# Patient Record
Sex: Male | Born: 2000 | Race: Black or African American | Hispanic: No | Marital: Single | Smoking: Never smoker
Health system: Southern US, Community
[De-identification: ages and names within clinical notes are randomized; demographics above are authoritative.]

---
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
## 2019-09-29 IMAGING — DX DG SHOULDER 2+V*R*
4 series · 4 of 4 positions shown · non-contrast
Comparison: None.

CLINICAL DATA: Basketball injury 1 month ago with persistent
shoulder pain, initial encounter

EXAM:
RIGHT SHOULDER - 2+ VIEW

[shoulder ap]
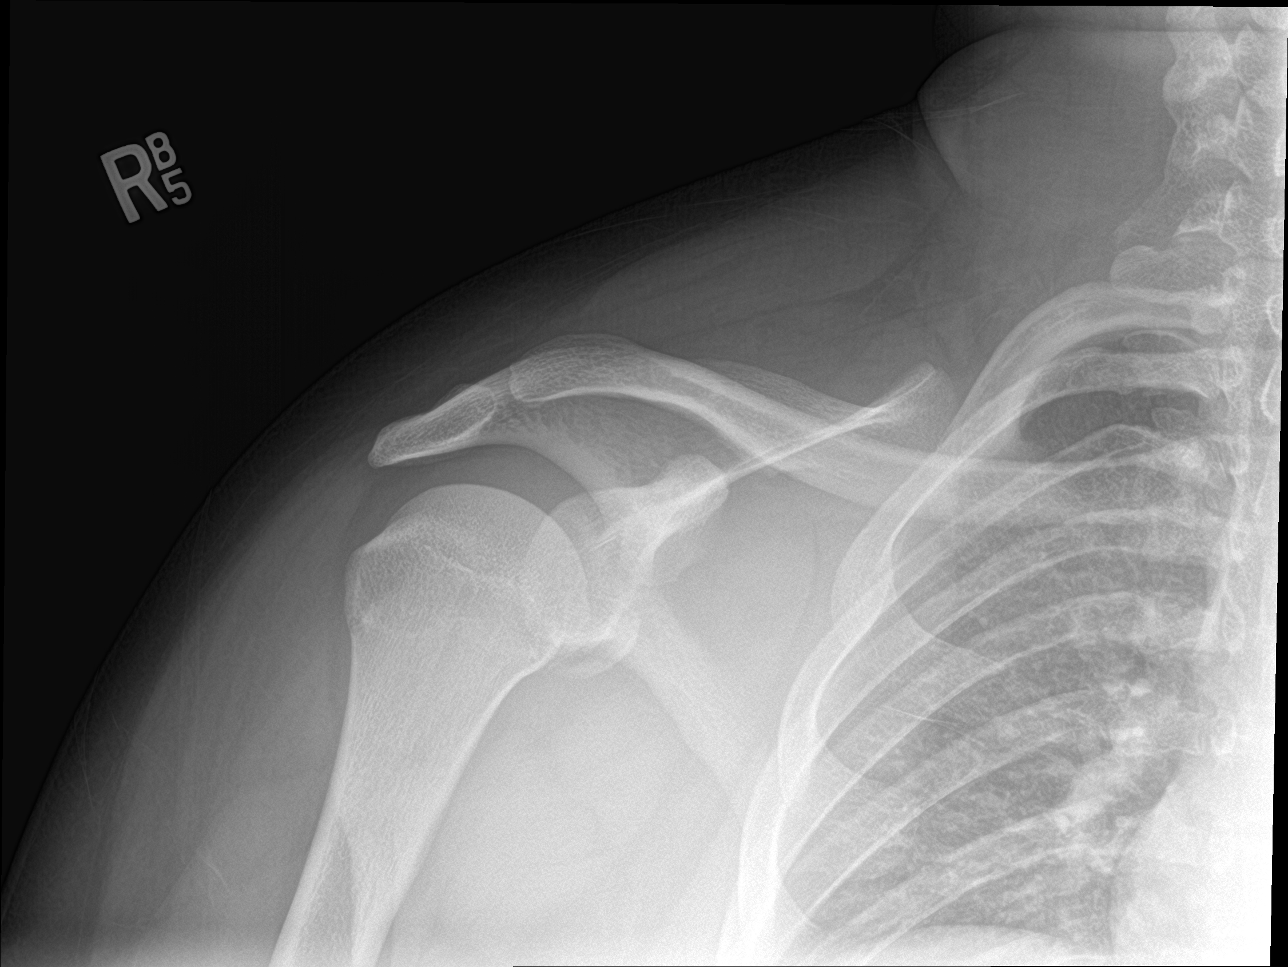

[shoulder grashey]
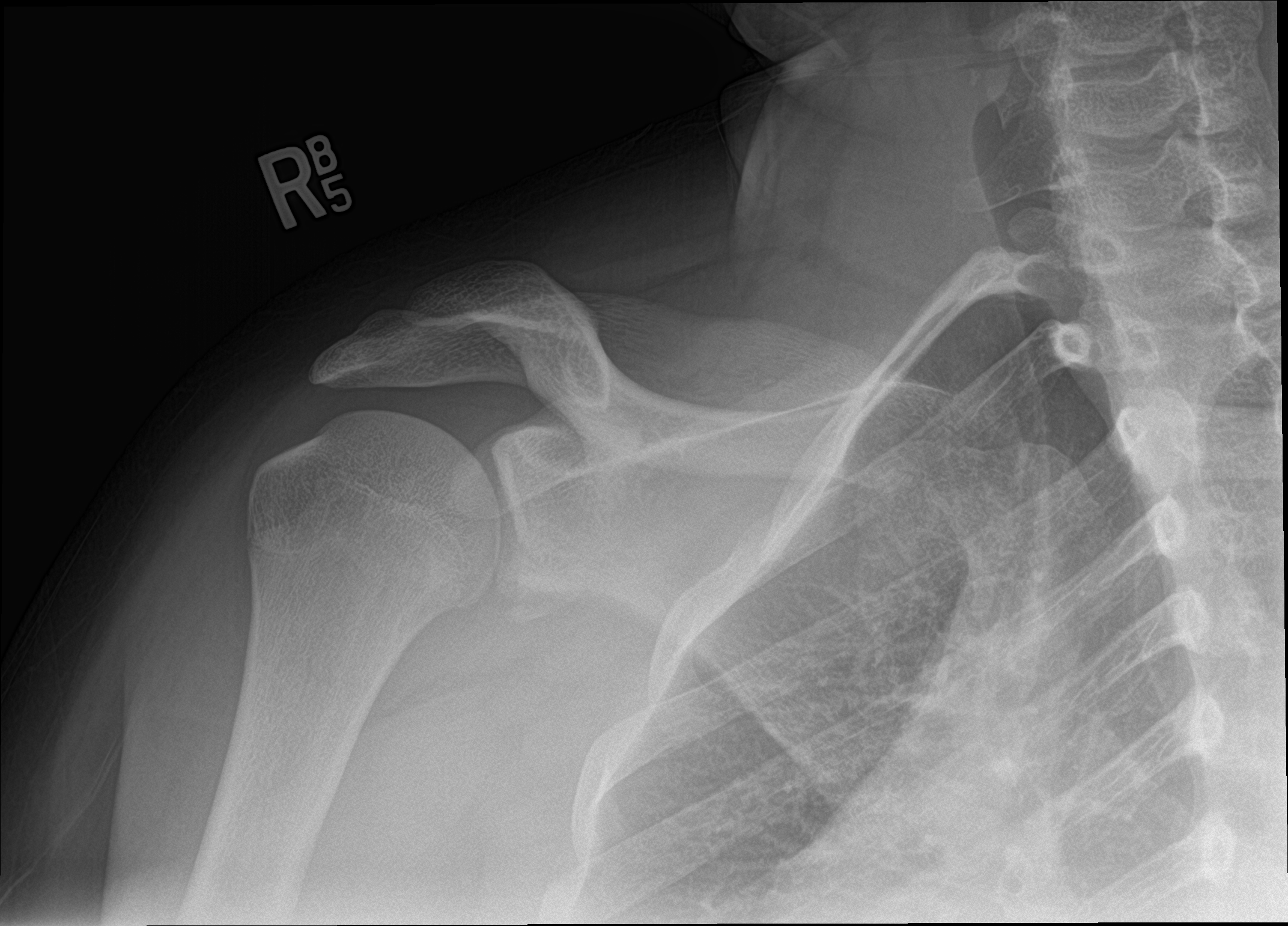

[shoulder y-view]
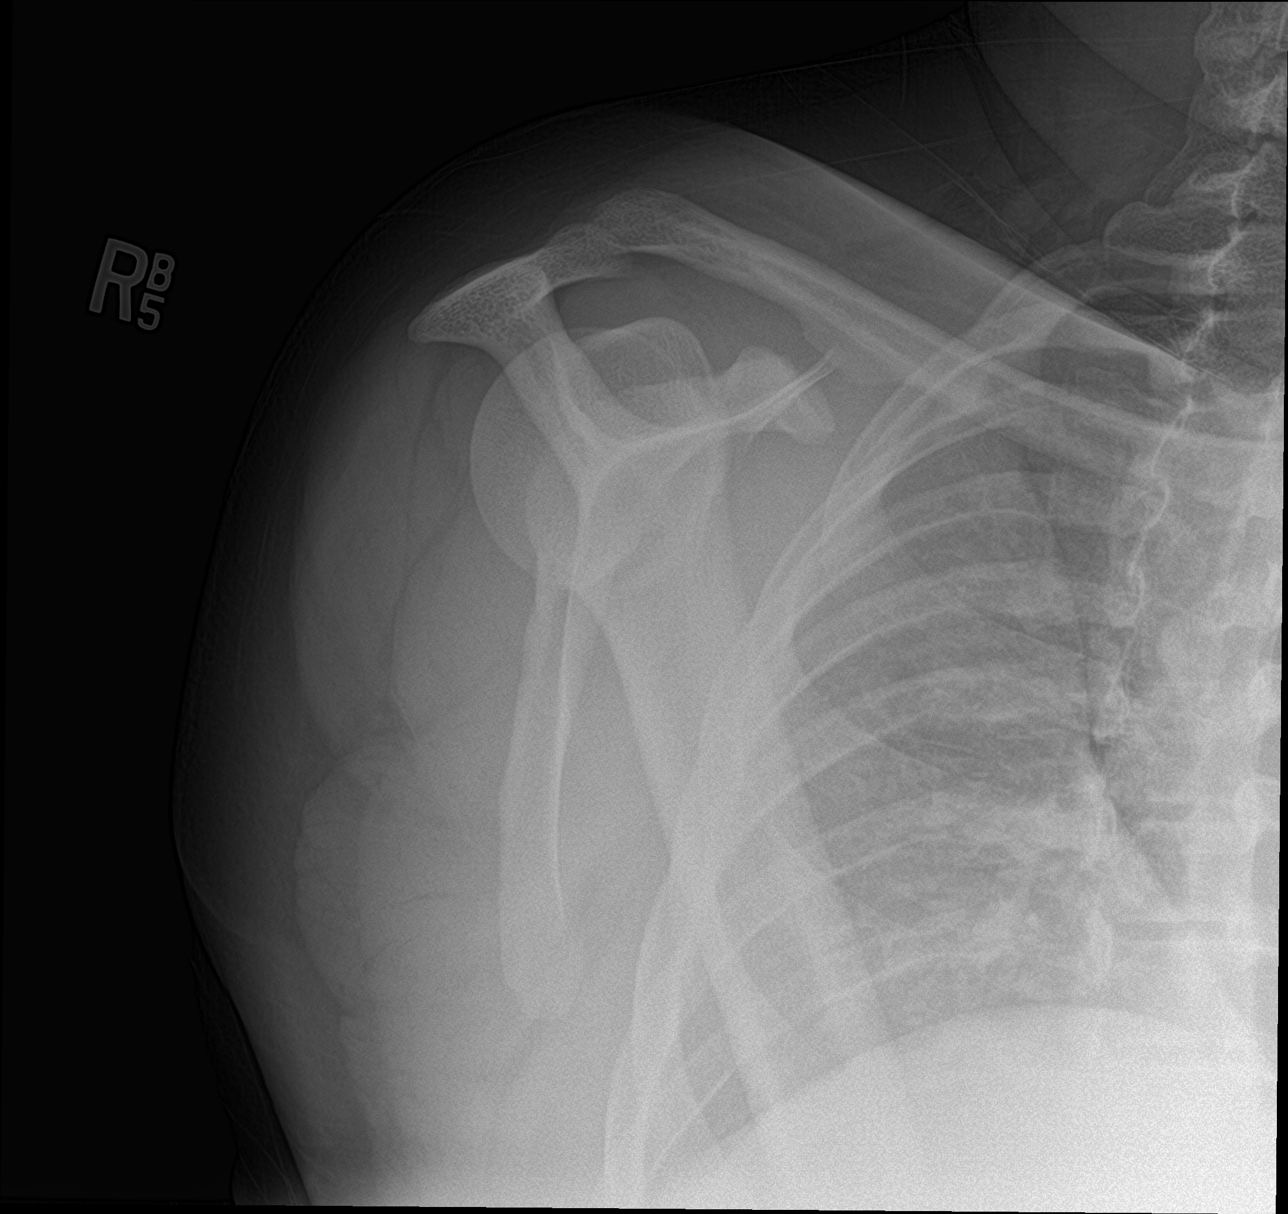

[shoulder axial]
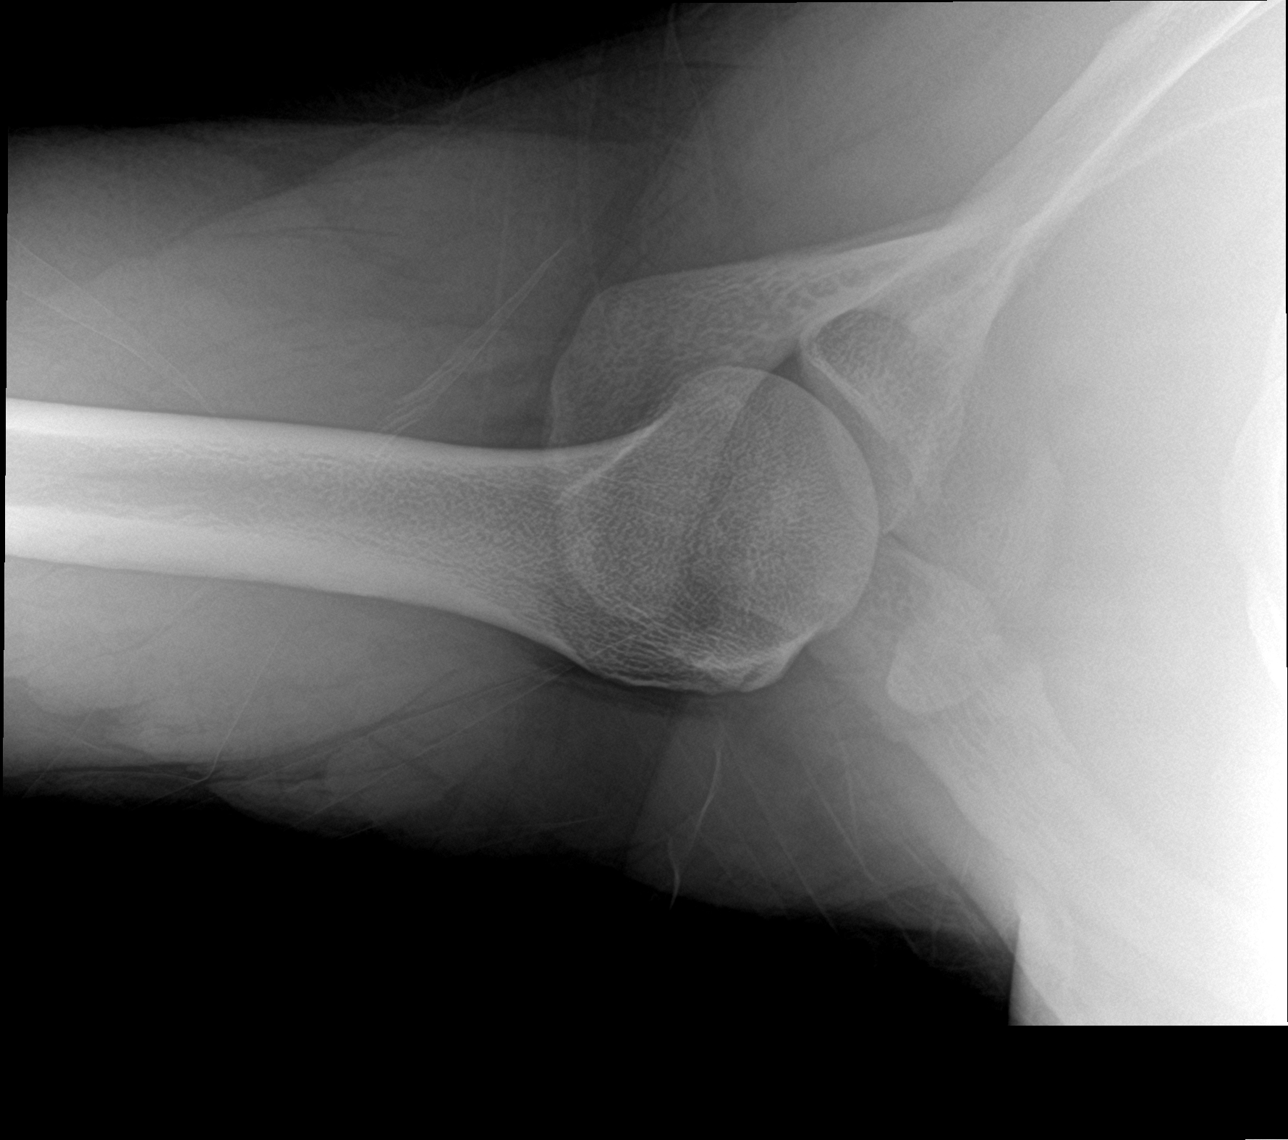

[4 of 4 positions shown; findings below may reference images not displayed]

FINDINGS: No dislocation is identified. There is irregularity noted at the
base of the glenoid consistent with a focal avulsion without
significant displacement. The underlying bony thorax is within
normal limits.
IMPRESSION: Minimally displaced fracture through the inferior aspect of the
glenoid. This is consistent with the patient's prior injury.
Nonemergent MRI may be helpful for further evaluation
